# Patient Record
Sex: Female | Born: 1960 | Race: White | Hispanic: No | Marital: Married | Smoking: Former smoker
Health system: Southern US, Community
[De-identification: ages and names within clinical notes are randomized; demographics above are authoritative.]

## PROBLEM LIST (undated history)

## (undated) DIAGNOSIS — K602 Anal fissure, unspecified: Secondary | ICD-10-CM

## (undated) DIAGNOSIS — K219 Gastro-esophageal reflux disease without esophagitis: Secondary | ICD-10-CM

## (undated) DIAGNOSIS — K5790 Diverticulosis of intestine, part unspecified, without perforation or abscess without bleeding: Secondary | ICD-10-CM

## (undated) DIAGNOSIS — I499 Cardiac arrhythmia, unspecified: Secondary | ICD-10-CM

## (undated) DIAGNOSIS — E785 Hyperlipidemia, unspecified: Secondary | ICD-10-CM

## (undated) DIAGNOSIS — G4730 Sleep apnea, unspecified: Secondary | ICD-10-CM

## (undated) DIAGNOSIS — I2699 Other pulmonary embolism without acute cor pulmonale: Secondary | ICD-10-CM

## (undated) HISTORY — DX: Gastro-esophageal reflux disease without esophagitis: K21.9

## (undated) HISTORY — PX: ANAL FISSURE REPAIR: SHX2312

## (undated) HISTORY — DX: Sleep apnea, unspecified: G47.30

## (undated) HISTORY — PX: DILATION AND CURETTAGE OF UTERUS: SHX78

## (undated) HISTORY — DX: Cardiac arrhythmia, unspecified: I49.9

## (undated) HISTORY — PX: APPENDECTOMY: SHX54

## (undated) HISTORY — PX: TONSILLECTOMY: SUR1361

## (undated) HISTORY — DX: Hyperlipidemia, unspecified: E78.5

## (undated) HISTORY — DX: Diverticulosis of intestine, part unspecified, without perforation or abscess without bleeding: K57.90

## (undated) HISTORY — PX: CHOLECYSTECTOMY: SHX55

---

## 2011-02-27 DIAGNOSIS — I2699 Other pulmonary embolism without acute cor pulmonale: Secondary | ICD-10-CM

## 2011-02-27 HISTORY — DX: Other pulmonary embolism without acute cor pulmonale: I26.99

## 2014-10-31 ENCOUNTER — Encounter (HOSPITAL_COMMUNITY): Payer: Self-pay | Admitting: Cardiology

## 2014-10-31 ENCOUNTER — Emergency Department (HOSPITAL_COMMUNITY): Payer: Managed Care, Other (non HMO)

## 2014-10-31 ENCOUNTER — Observation Stay (HOSPITAL_COMMUNITY)
Admission: EM | Admit: 2014-10-31 | Discharge: 2014-11-01 | Disposition: A | Discharge status: Home / Self Care | Payer: Managed Care, Other (non HMO) | Attending: Family Medicine | Admitting: Family Medicine

## 2014-10-31 ENCOUNTER — Observation Stay (HOSPITAL_COMMUNITY): Payer: Managed Care, Other (non HMO)

## 2014-10-31 DIAGNOSIS — Z86711 Personal history of pulmonary embolism: Secondary | ICD-10-CM | POA: Diagnosis not present

## 2014-10-31 DIAGNOSIS — I2699 Other pulmonary embolism without acute cor pulmonale: Secondary | ICD-10-CM | POA: Diagnosis not present

## 2014-10-31 DIAGNOSIS — Z79899 Other long term (current) drug therapy: Secondary | ICD-10-CM | POA: Insufficient documentation

## 2014-10-31 DIAGNOSIS — Z7901 Long term (current) use of anticoagulants: Secondary | ICD-10-CM | POA: Diagnosis not present

## 2014-10-31 DIAGNOSIS — R0600 Dyspnea, unspecified: Secondary | ICD-10-CM | POA: Diagnosis not present

## 2014-10-31 DIAGNOSIS — E785 Hyperlipidemia, unspecified: Secondary | ICD-10-CM | POA: Diagnosis not present

## 2014-10-31 DIAGNOSIS — R0602 Shortness of breath: Secondary | ICD-10-CM | POA: Diagnosis present

## 2014-10-31 DIAGNOSIS — R0609 Other forms of dyspnea: Secondary | ICD-10-CM

## 2014-10-31 HISTORY — DX: Anal fissure, unspecified: K60.2

## 2014-10-31 HISTORY — DX: Other pulmonary embolism without acute cor pulmonale: I26.99

## 2014-10-31 LAB — COMPREHENSIVE METABOLIC PANEL
ALBUMIN: 4.6 g/dL (ref 3.5–5.0)
ALT: 31 U/L (ref 14–54)
AST: 23 U/L (ref 15–41)
Alkaline Phosphatase: 68 U/L (ref 38–126)
Anion gap: 8 (ref 5–15)
BILIRUBIN TOTAL: 0.6 mg/dL (ref 0.3–1.2)
BUN: 14 mg/dL (ref 6–20)
CHLORIDE: 109 mmol/L (ref 101–111)
CO2: 25 mmol/L (ref 22–32)
CREATININE: 0.71 mg/dL (ref 0.44–1.00)
Calcium: 10.1 mg/dL (ref 8.9–10.3)
GFR calc Af Amer: >60 mL/min (ref >60)
GLUCOSE: 111 mg/dL — AB (ref 65–99)
POTASSIUM: 3.8 mmol/L (ref 3.5–5.1)
Sodium: 142 mmol/L (ref 135–145)
TOTAL PROTEIN: 7.2 g/dL (ref 6.5–8.1)

## 2014-10-31 LAB — TROPONIN I: Troponin I: <0.03 ng/mL (ref <0.031)

## 2014-10-31 LAB — CBC WITH DIFFERENTIAL/PLATELET
BASOS ABS: 0 10*3/uL (ref 0.0–0.1)
BASOS PCT: 1 % (ref 0–1)
Eosinophils Absolute: 0.1 10*3/uL (ref 0.0–0.7)
Eosinophils Relative: 2 % (ref 0–5)
HEMATOCRIT: 41.7 % (ref 36.0–46.0)
Hemoglobin: 14.7 g/dL (ref 12.0–15.0)
LYMPHS PCT: 44 % (ref 12–46)
Lymphs Abs: 2.2 10*3/uL (ref 0.7–4.0)
MCH: 32.4 pg (ref 26.0–34.0)
MCHC: 35.3 g/dL (ref 30.0–36.0)
MCV: 91.9 fL (ref 78.0–100.0)
Monocytes Absolute: 0.4 10*3/uL (ref 0.1–1.0)
Monocytes Relative: 8 % (ref 3–12)
NEUTROS ABS: 2.4 10*3/uL (ref 1.7–7.7)
NEUTROS PCT: 45 % (ref 43–77)
Platelets: 259 10*3/uL (ref 150–400)
RBC: 4.54 MIL/uL (ref 3.87–5.11)
RDW: 12.4 % (ref 11.5–15.5)
WBC: 5.1 10*3/uL (ref 4.0–10.5)

## 2014-10-31 LAB — BRAIN NATRIURETIC PEPTIDE: B Natriuretic Peptide: 41 pg/mL (ref 0.0–100.0)

## 2014-10-31 MED ORDER — ASPIRIN EC 325 MG PO TBEC
325.0000 mg | DELAYED_RELEASE_TABLET | Freq: Every day | ORAL | Status: DC
Start: 2014-10-31 — End: 2014-11-01
  Administered 2014-10-31 – 2014-11-01 (×2): 325 mg via ORAL
  Filled 2014-10-31 (×2): qty 1

## 2014-10-31 MED ORDER — IOHEXOL 350 MG/ML SOLN
100.0000 mL | Freq: Once | INTRAVENOUS | Status: AC | PRN
  Administered 2014-10-31: 100 mL via INTRAVENOUS

## 2014-10-31 MED ORDER — RIVAROXABAN 20 MG PO TABS
20.0000 mg | ORAL_TABLET | Freq: Every day | ORAL | Status: DC
  Administered 2014-10-31: 20 mg via ORAL
  Filled 2014-10-31: qty 1

## 2014-10-31 MED ORDER — ROSUVASTATIN CALCIUM 10 MG PO TABS: 5.0000 mg | ORAL_TABLET | ORAL | Status: DC

## 2014-10-31 MED ORDER — ONDANSETRON HCL 4 MG/2ML IJ SOLN: 4.0000 mg | Freq: Four times a day (QID) | INTRAMUSCULAR | Status: DC | PRN

## 2014-10-31 MED ORDER — HYDROMORPHONE HCL 1 MG/ML IJ SOLN: 1.0000 mg | INTRAMUSCULAR | Status: AC | PRN

## 2014-10-31 MED ORDER — ACETAMINOPHEN 325 MG PO TABS
650.0000 mg | ORAL_TABLET | ORAL | Status: DC | PRN
  Administered 2014-10-31 – 2014-11-01 (×2): 650 mg via ORAL
  Filled 2014-10-31 (×2): qty 2

## 2014-10-31 MED ORDER — ONDANSETRON HCL 4 MG/2ML IJ SOLN
4.0000 mg | Freq: Three times a day (TID) | INTRAMUSCULAR | Status: DC | PRN
Start: 2014-10-31 — End: 2014-10-31

## 2014-10-31 NOTE — ED Notes (Signed)
Report given to [place], RN for room 323.

## 2014-10-31 NOTE — ED Provider Notes (Signed)
CSN: [id number]     Arrival date & time 10/31/14  1056 History  This chart was scribed for Lavera Guise, MD by Ronney Lion, ED Scribe. This patient was seen in room APA18/APA18 and the patient's care was started at 11:26 AM.    Chief Complaint  Patient presents with  . Shortness of Breath   The history is provided by the patient. No language interpreter was used.    HPI Comments: Sandi Towe is a 54 y.o. female with a history of hyperlipidemia and PE, who presents to the Emergency Department complaining of DOE that began 1 week ago when patient was walking through [employer] and suddenly felt SOB; she had gone home that evening and felt lightheaded and dizzy. Patient also notes an occasional flutter sensation in her chest. She states she had difficulty initially falling asleep due to her SOB, so she used her CPAP, which she has for sleep apnea, and slept on 2 pillows, both of which provided moderate relief.  Patient also reports feeling winded whenever she walks.   Patient is concerned because this may be a PE as she reports mid back pain that began 3-4 days ago that similar to the back pain she had during her first PE. She is currently anticoagulated on Xarelto, which she reports she has been compliant with.She had been evaluated at [facility] ED, where she had a D-dimer that was negative. Patient was also referred to a cardiologist at that time, and she scheduled to see a cardiologist on the 9th. She notes a history of "bad valves" in her family, noting that her brother died suddenly at age 9 and her nephew died at age 31 due to this.  She denies chest pain, leg swelling, abdominal pain, cough, congestion, rhinorrhea, vomiting, diarrhea, or any urinary symptoms.  Patient has a tick bite on her right shoulder. 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     Past Medical History  Diagnosis Date  . PE (pulmonary embolism)   . Anal fissure    Past Surgical  History  Procedure Laterality Date  . Cholecystectomy    . Appendectomy     History reviewed. No pertinent family history. Social History  Substance Use Topics  . Smoking status: Never Smoker   . Smokeless tobacco: None  . Alcohol Use: No   OB History    No data available     Review of Systems  HENT: Negative for congestion and rhinorrhea.   Respiratory: Positive for shortness of breath. Negative for cough.   Cardiovascular: Negative for chest pain and leg swelling.  Gastrointestinal: Negative for vomiting and diarrhea.  Genitourinary: Negative for dysuria, urgency, frequency, hematuria, decreased urine volume and difficulty urinating.  All other systems reviewed and are negative.   Allergies  Review of patient's allergies indicates no known allergies.  Home Medications   Prior to Admission medications   Medication Sig Start Date End Date Taking? Authorizing Provider  calcium-vitamin D (OSCAL WITH D) 500-200 MG-UNIT per tablet Take 1 tablet by mouth at bedtime.   Yes Historical Provider, MD  fenofibrate micronized (LOFIBRA) 134 MG capsule Take 134 mg by mouth every Monday, Wednesday, and Friday.   Yes Historical Provider, MD  Multiple Vitamin (MULTIVITAMIN WITH MINERALS) TABS tablet Take 1 tablet by mouth at bedtime.   Yes Historical Provider, MD  naproxen sodium (ALEVE) 220 MG tablet Take 440 mg by mouth 2 (two) times daily as needed (Pain).  Yes Historical Provider, MD  rivaroxaban (XARELTO) 20 MG TABS tablet Take 20 mg by mouth daily with supper.   Yes Historical Provider, MD  rosuvastatin (CRESTOR) 5 MG tablet Take 5 mg by mouth every Monday, Wednesday, and Friday.   Yes Historical Provider, MD   BP 124/66 mmHg  Pulse 60  Temp(Src) 98 F (36.7 C) (Oral)  Resp 18  Ht 5\' 5"  (1.651 m)  Wt 175 lb (79.379 kg)  BMI 29.12 kg/m2  SpO2 97% Physical Exam  Constitutional: She is oriented to person, place, and time. She appears well-developed and well-nourished. No distress.   HENT:  Head: Normocephalic and atraumatic.  Eyes: Conjunctivae and EOM are normal.  Neck: Neck supple. No tracheal deviation present.  Cardiovascular: Normal rate.   No BLE edema. No JVD.  Pulmonary/Chest: Effort normal. No respiratory distress.  Abdominal: Soft. She exhibits no distension. There is no tenderness.  Musculoskeletal: Normal range of motion. She exhibits no edema.  Neurological: She is alert and oriented to person, place, and time.  Skin: Skin is warm and dry.  Psychiatric: She has a normal mood and affect. Her behavior is normal.  Nursing note and vitals reviewed.   ED Course  Procedures (including critical care time)  DIAGNOSTIC STUDIES: Oxygen Saturation is 100% on RA, normal by my interpretation.    COORDINATION OF CARE: 11:32 AM - Discussed treatment plan with pt at bedside which includes basic blood tests, EKG, and CXR; also possibly CT scan pending CXR results. Will also perform echocardiogram. Pt has no other questions at this time. Discussed this with pt, who verbalized understanding and agreed to plan.   Labs Review Labs Reviewed  COMPREHENSIVE METABOLIC PANEL - Abnormal; Notable for the following:    Glucose, Bld 111 (*)    All other components within normal limits  CBC WITH DIFFERENTIAL/PLATELET  TROPONIN I  BRAIN NATRIURETIC PEPTIDE    Imaging Review Dg Chest 2 View  10/31/2014   CLINICAL DATA:  Shortness of breath. Lightheaded. Contents her breath.  EXAM: CHEST  2 VIEW  COMPARISON:  None.  FINDINGS: The heart size and mediastinal contours are within normal limits. Both lungs are clear. The visualized skeletal structures are unremarkable.  IMPRESSION: No active cardiopulmonary disease.   Electronically Signed   By: Elige Ko   On: 10/31/2014 12:43   I have personally reviewed and evaluated these images and lab results as part of my medical decision-making.   EKG Interpretation   Date/Time:  Sunday October 31 2014 11:42:21 EDT Ventricular  Rate:  57 PR Interval:  149 QRS Duration: 113 QT Interval:  398 QTC Calculation: 387 R Axis:   47 Text Interpretation:  Sinus bradycardia.  Poor r-wave progression TWI in  V2  Confirmed by Evamae Rowen MD, Katelin Kutsch [phone]) on 10/31/2014 12:47:14 PM      MDM   Final diagnoses:  Dyspnea on exertion    54 year old female with history of  hyperlipidemia and PE on Xarelto who presents with one week of dyspnea on exertion. She is well-appearing and nontoxic on presentation, with normal vital signs. She does not appear fluid overloaded on exam. The remainder of her exam is otherwise unremarkable and nonfocal. EKG is not ischemic in nature. Chest x-ray shows no acute cardiopulmonary processes, and troponin 1 is negative.   she recently had a negative d-dimer at an outside hospital and has been compliant with Xarelto.  my suspicion for additional PE burden is low at this time. she does notably have a heart  score of 5 for multiple risk factors, age, non-specific EKG changes, and Moderately suspicious story. Plan to admit for serial troponins and cardiac rule out.   Lavera Guise, MD 10/31/14 1322

## 2014-10-31 NOTE — ED Notes (Signed)
BP taken twice for verification.

## 2014-10-31 NOTE — ED Notes (Signed)
MD at bedside. 

## 2014-10-31 NOTE — ED Notes (Signed)
Hospitalist at bedside at this time,

## 2014-10-31 NOTE — ED Notes (Addendum)
Last Saturday had a episode where she was light headed and couldn't catch her breath.  Tuesday continued to have some sob and some back pain.   Wed went to the doctor. And was told she needed to f/u with cardiologist.  Has not seen cardiologist.  Burgess Estelle she seen another doctor for same.  Continues to have sob and back pain. Concerned because she has a history of PE's.  Take xarelto. Pt has medical records from doctors visits this week.

## 2014-10-31 NOTE — ED Notes (Signed)
PT maintained 02 sats >95% with ambulation.

## 2014-10-31 NOTE — H&P (Signed)
History and Physical  Kayla Hartman JWJ:[id number] DOB: November 30, 1960 DOA: 10/31/2014  Referring physician: Lavera Guise, MD PCP: No primary care provider on file.   Chief Complaint: Shortness of breath  HPI:  17 yow with PMH PE 2013, 2014 unprovoked,  presented to the ED with complaints of DOE. Initial evaluation was unrevealing and plans were made for observation, serial troponin and further evaluation.  Symptoms onset 8/27 she recalls not feeling well, light headness, shortness of breath, and diaphoresis. Shortness of breath, low energy, and back pain increasingly worsened without alleviating factors other than rest. Went to the ED in [place] 8/30 and was discharged with instructions to follow up with cardiologist. Had office visit 9/3 for ongoing symptoms of shortness of breath, upper back pain and chest tightness and was instructed to go to the ED, but instead went home.   Reports chest discomfort described as tight in the center of her chest, non radiating, worsened when trying to take a deep breath, and severity is rated as 4/10. Back pain is more so under her shoulder, is intermittent and worsened with exertion. Denies fever. Sore throat rash, muscle aches, nausea, vomiting, hematuria, night sweats. No orthopnea and does not wake up at night short of breath but does note shortness of breath when trying to go to sleep, primarily her shortness of breath is with exertion. No lower extremity edema. She reports compliance with Xarelto.  Record review  she brings records from outside facility with her  8/30 (ED). Vitals unremarkable, CBC, CMP unremarkable except calcium 10.6. D-dimer <0.27, troponin negative. CXR  Negative, shows impression near syncope SOB. EKG unremarkable.  9/3 (Office visit) Complaints of SOB and back pain was instructioned to go to the ED   In the emergency department here VSS, not hypoxic, afebrile.  Pertinent labs: CBC, BMP and troponin negative and CMP WNL. EKG:  Independently reviewed. SB nonspecific T waves changes.  Imaging: independently reviewed.CXR revealed no active cardiopulmonary disease.  Review of Systems:  Positive for SOB, chest discomfort  Negative for fever, visual changes, sore throat, rash, new muscle aches, dysuria, bleeding, n/v/abdominal pain.  Past Medical History  Diagnosis Date  . PE (pulmonary embolism) 2013    also 2014  . Anal fissure     Past Surgical History  Procedure Laterality Date  . Cholecystectomy    . Appendectomy    . Anal fissure repair      Social History:  reports that she has never smoked. She does not have any smokeless tobacco history on file. She reports that she does not drink alcohol or use illicit drugs. lives with their spouse Self-care  No Known Allergies  Family History  Problem Relation Age of Onset  . Cancer Mother     breast, ovarian     Prior to Admission medications   Medication Sig Start Date End Date Taking? Authorizing Provider  calcium-vitamin D (OSCAL WITH D) 500-200 MG-UNIT per tablet Take 1 tablet by mouth at bedtime.   Yes Historical Provider, MD  fenofibrate micronized (LOFIBRA) 134 MG capsule Take 134 mg by mouth every Monday, Wednesday, and Friday.   Yes Historical Provider, MD  Multiple Vitamin (MULTIVITAMIN WITH MINERALS) TABS tablet Take 1 tablet by mouth at bedtime.   Yes Historical Provider, MD  naproxen sodium (ALEVE) 220 MG tablet Take 440 mg by mouth 2 (two) times daily as needed (Pain).   Yes Historical Provider, MD  rivaroxaban (XARELTO) 20 MG TABS tablet Take 20 mg by mouth daily with supper.  Yes Historical Provider, MD  rosuvastatin (CRESTOR) 5 MG tablet Take 5 mg by mouth every Monday, Wednesday, and Friday.   Yes Historical Provider, MD   Physical Exam: Filed Vitals:   10/31/14 1130 10/31/14 1200 10/31/14 1230 10/31/14 1245  BP: 198/92 121/81 124/66   Pulse: 70 58 56 60  Temp:      TempSrc:      Resp: 22 17 18    Height:      Weight:      SpO2:  98% 94% 98% 97%    General: Appears calm and comfortable. Sitting up in bed.  Eyes: PERRL, normal lids, irises & conjunctiva ENT: grossly normal hearing, lips & tongue Neck: no LAD, masses or thyromegaly Cardiovascular: RRR, no m/r/g. No LE edema. Respiratory: CTA bilaterally, no w/r/r. Normal respiratory effort. Able to speak in full sentences.  Abdomen: soft, ntnd Skin: no rash or induration noted  Musculoskeletal: grossly normal tone BUE/BLE Psychiatric: grossly normal mood and affect, speech fluent and appropriate Neurologic: grossly non-focal.  Wt Readings from Last 3 Encounters:  10/31/14 79.379 kg (175 lb)    Labs on Admission:  Basic Metabolic Panel:  Recent Labs Lab 10/31/14 1202  NA 142  K 3.8  CL 109  CO2 25  GLUCOSE 111*  BUN 14  CREATININE 0.71  CALCIUM 10.1    Liver Function Tests:  Recent Labs Lab 10/31/14 1202  AST 23  ALT 31  ALKPHOS 68  BILITOT 0.6  PROT 7.2  ALBUMIN 4.6    CBC:  Recent Labs Lab 10/31/14 1202  WBC 5.1  NEUTROABS 2.4  HGB 14.7  HCT 41.7  MCV 91.9  PLT 259    Cardiac Enzymes:  Recent Labs Lab 10/31/14 1202  TROPONINI <0.03    Radiological Exams on Admission: Dg Chest 2 View  10/31/2014   CLINICAL DATA:  Shortness of breath. Lightheaded. Contents her breath.  EXAM: CHEST  2 VIEW  COMPARISON:  None.  FINDINGS: The heart size and mediastinal contours are within normal limits. Both lungs are clear. The visualized skeletal structures are unremarkable.  IMPRESSION: No active cardiopulmonary disease.   Electronically Signed   By: Elige Ko   On: 10/31/2014 12:43      Principal Problem:   DOE (dyspnea on exertion) Active Problems:   Pulmonary embolism   Assessment/Plan 1. DOE, thoracic back pain with h/o PE. No history of CAD, negative of LHC 2005.Also consider anginal equivalency. Troponin negative, BNP unremarkable, no lower extremity edema and EKG non acute, no evidence of ACS. 2.  PE 2013 treated with 6  months of anticoagulation and again 2014 unprovoked. On indefinite Xarelto. Reports strict compliance.   Plan observation. Etiology is unclear at this point. Consider anginal equivalent, CAD. PE seems less likely given negative d-dimer and strict compliance with Xarelto but she wishes further investigation with chest CT, understands exposure to radiation.  Telemetry and trend troponin. Check 2D ECHO  CTA chest rule out PE  If negative likely discharge tomorrow with instructions to follow up with cardiology for consideration of stress test.   Code Status: FULL   DVT prophylaxis:Xarelto  Family Communication:Husband bedside, discussed in detail Disposition Plan/Anticipated LOS: Overnight observation, anticipate discharge home tomorrow.   Time spent: 55 minutes  Brendia Sacks, MD  [facility] Pager [phone] 10/31/2014, 1:16 PM   I, Princella Pellegrini. Jari Pigg, acting as scribe, recorded this note contemporaneously in the presence of Dr. Melton Alar. Irene Limbo, M.D. on 10/31/2014 .  I have reviewed the above documentation  for accuracy and completeness, and I agree with the above. Brendia Sacks, MD

## 2014-11-01 ENCOUNTER — Observation Stay (HOSPITAL_BASED_OUTPATIENT_CLINIC_OR_DEPARTMENT_OTHER): Payer: Managed Care, Other (non HMO)

## 2014-11-01 DIAGNOSIS — I2699 Other pulmonary embolism without acute cor pulmonale: Secondary | ICD-10-CM | POA: Diagnosis not present

## 2014-11-01 DIAGNOSIS — R0600 Dyspnea, unspecified: Secondary | ICD-10-CM | POA: Diagnosis not present

## 2014-11-01 DIAGNOSIS — R0609 Other forms of dyspnea: Secondary | ICD-10-CM | POA: Diagnosis not present

## 2014-11-01 LAB — TROPONIN I

## 2014-11-01 NOTE — Care Management Note (Signed)
Case Management Note  Patient Details  Name: Leotta Weingarten MRN: [id number] Date of Birth: 04/06/1960  Expected Discharge Date:  11/02/14               Expected Discharge Plan:  Home/Self Care  In-House Referral:  NA  Discharge planning Services  CM Consult  Post Acute Care Choice:  NA Choice offered to:  NA  DME Arranged:    DME Agency:     HH Arranged:    HH Agency:     Status of Service:  Completed, signed off  Medicare Important Message Given:    Date Medicare IM Given:    Medicare IM give by:    Date Additional Medicare IM Given:    Additional Medicare Important Message give by:     If discussed at Long Length of Stay Meetings, dates discussed:    Additional Comments: Pt is from home, lives alone and independent at baseline. Pt discharging home with self care today. Pt does not meet requirements for home O2. No CM needs noted.  Malcolm Metro, RN 11/01/2014, 3:51 PM

## 2014-11-01 NOTE — Progress Notes (Signed)
Notified Goodrich MD to ask if the patients diet could be advanced.  Voiced to him according to the ECHO Tech that the nurse who does this test is not here.  New orders given and followed.

## 2014-11-01 NOTE — Discharge Summary (Signed)
Physician Discharge Summary  Kayla Hartman ZOX:[id number] DOB: Jul 13, 1960 DOA: 10/31/2014  Cardiologist: Mable Paris, MD   Admit date: 10/31/2014 Discharge date: 11/01/2014  Recommendations for Outpatient Follow-up:  1. Follow up with cardiologist for DOE and consideration of stress test.   Follow-up Information    Follow up with HUFFMAN, Vonzell Schlatter, MD.   Specialty:  Internal Medicine   Why:  As needed   Contact information:   [address] [place] [address] [phone]        Discharge Diagnoses:  1. DOE 2. History of PE  Discharge Condition: Improved  Disposition: Discharge home   Diet recommendation: Heart healthy   Filed Weights   10/31/14 1104 10/31/14 1623  Weight: 79.379 kg (175 lb) 83.178 kg (183 lb 6 oz)    History of present illness:  44 yow with PMH PE 2013, 2014 unprovoked, presented to the ED with complaints of DOE. Initial evaluation was unrevealing and plans were made for observation, serial troponin and further evaluation.  Hospital Course:  Mrs. Neuenfeldt hospitalization was uncomplicated. DOE improved spontaneously with no hypoxia or SOB while ambulating. All labs and imagining were unremarkable, EKG non acute, telemetry SB and troponins negative. CTA chest negative. 2d echocardiogram reassuring. There was no evidence of ACS or PE. Etiology of DOE remains unclear. Instructed to keep follow up with cardiologist this week for consideration of stress testing and further evaluation.  Individual issues as below:    1. DOE, thoracic back pain with h/o PE. CT of angiogram negative. Telemetry SB. No history of CAD, negative of LHC 2005. Consider anginal equivalent. No current pain. Troponin negative, BNP unremarkable, echocardiogram reassuring, EKG non acute, no evidence of ACS. 2. PE 2013 treated with 6 months of anticoagulation and again 2014 unprovoked. On indefinite Xarelto.  Consultants:  None (cardiology not available over the  weekend)  Procedures:  Echo Transthoracic Echocardiography  Patient:  Kayla Hartman, Kayla Hartman MR #:    [id number] Study Date: 11/01/2014 Gender:   F Age:    54 Height:   162.6 cm Weight:   83 kg BSA:    1.97 m^2 Pt. Status: Room:    A323  ADMITTING  Standley Brooking ATTENDING  Warnell Forester REFERRING  Standley Brooking SONOGRAPHER Jeryl Columbia PERFORMING  [facility], Jeani Hawking  cc:  ------------------------------------------------------------------- LV EF: 55% -  60%  ------------------------------------------------------------------- Indications:   Dyspnea 786.09.  ------------------------------------------------------------------- History:  PMH: Dyspnea on exertion, Pulmonary Embolism, Patient stated,&quot; Mother, Father and brother had mitral valve disorders. Nephew died age 71 from arrhythmia.&quot;  ------------------------------------------------------------------- Study Conclusions  - Left ventricle: The cavity size was normal. Wall thickness was increased in a pattern of mild LVH. Systolic function was normal. The estimated ejection fraction was in the range of 55% to 60%. Wall motion was normal; there were no regional wall motion abnormalities. - Atrial septum: No defect or patent foramen ovale was identified.  Transthoracic echocardiography. M-mode, complete 2D, spectral Doppler, and color Doppler. Birthdate: Patient birthdate: [birth year]. Age: Patient is 54 yr old. Sex: Gender: female. BMI: 31.4 kg/m^2. Blood pressure:   133/70 Patient status: Inpatient. Study date: Study date: 11/01/2014. Study time: 08:26 AM. Location: Bedside.  -------------------------------------------------------------------  ------------------------------------------------------------------- Left ventricle: The cavity size was normal. Wall thickness was increased in a pattern of  mild LVH. Systolic function was normal. The estimated ejection fraction was in the range of 55% to 60%. Wall motion was normal; there were no regional wall motion abnormalities.  -------------------------------------------------------------------  Aortic valve:  Trileaflet; normal thickness leaflets. Mobility was not restricted. Doppler: Transvalvular velocity was within the normal range. There was no stenosis. There was no regurgitation.  ------------------------------------------------------------------- Aorta: Aortic root: The aortic root was normal in size.  ------------------------------------------------------------------- Mitral valve:  Structurally normal valve.  Mobility was not restricted. Doppler: Transvalvular velocity was within the normal range. There was no evidence for stenosis. There was no regurgitation.  Peak gradient (D): 4 mm Hg.  ------------------------------------------------------------------- Left atrium: The atrium was normal in size.  ------------------------------------------------------------------- Atrial septum: No defect or patent foramen ovale was identified.  ------------------------------------------------------------------- Right ventricle: The cavity size was normal. Wall thickness was normal. Systolic function was normal.  ------------------------------------------------------------------- Pulmonic valve:  Doppler: Transvalvular velocity was within the normal range. There was no evidence for stenosis.  ------------------------------------------------------------------- Tricuspid valve:  Structurally normal valve.  Doppler: Transvalvular velocity was within the normal range. There was trivial regurgitation.  ------------------------------------------------------------------- Pulmonary artery:  The main pulmonary artery was normal-sized. Systolic pressure was within the normal  range.  ------------------------------------------------------------------- Right atrium: The atrium was normal in size.  ------------------------------------------------------------------- Pericardium: The pericardium was normal in appearance. There was no pericardial effusion.  ------------------------------------------------------------------- Systemic veins: Inferior vena cava: The vessel was normal in size.  ------------------------------------------------------------------- Measurements  Left ventricle             Value    Reference LV ID, ED, PLAX chordal    (L)   41.2 mm   43 - 52 LV ID, ES, PLAX chordal        26.5 mm   23 - 38 LV fx shortening, PLAX chordal     36  %   >=29 LV PW thickness, ED          14.3 mm   --------- IVS/LV PW ratio, ED          0.92     <=1.3 LV e&', lateral             10.1 cm/s  --------- LV E/e&', lateral            9.5     --------- LV e&', medial             7.72 cm/s  --------- LV E/e&', medial            12.44    --------- LV e&', average             8.91 cm/s  --------- LV E/e&', average            10.77    ---------  Ventricular septum           Value    Reference IVS thickness, ED           13.2 mm   ---------  LVOT                  Value    Reference LVOT ID, S               20  mm   --------- LVOT area               3.14 cm^2  ---------  Aorta                 Value    Reference Aortic root ID, ED           28  mm   ---------  Left atrium  Value    Reference LA ID, A-P, ES             37  mm   --------- LA ID/bsa, A-P             1.88 cm/m^2 <=2.2 LA volume, S               57.2 ml   --------- LA volume/bsa, S            29.1 ml/m^2 --------- LA volume, ES, 1-p A4C         63.7 ml   --------- LA volume/bsa, ES, 1-p A4C       32.4 ml/m^2 --------- LA volume, ES, 1-p A2C         48.8 ml   --------- LA volume/bsa, ES, 1-p A2C       24.8 ml/m^2 ---------  Mitral valve              Value    Reference Mitral E-wave peak velocity      96  cm/s  --------- Mitral A-wave peak velocity      65  cm/s  --------- Mitral deceleration time    (H)   264  ms   150 - 230 Mitral peak gradient, D        4   mm Hg --------- Mitral E/A ratio, peak         1.5     ---------  Pulmonary arteries           Value    Reference PA pressure, S, DP           27  mm Hg <=30  Tricuspid valve            Value    Reference Tricuspid regurg peak velocity     221  cm/s  --------- Tricuspid peak RV-RA gradient     20  mm Hg ---------  Systemic veins             Value    Reference Estimated CVP             7   mm Hg ---------  Right ventricle            Value    Reference TAPSE                 19.8 mm   --------- RV pressure, S, DP           27  mm Hg <=30  Legend: (L) and (H) mark values outside specified reference range.  ------------------------------------------------------------------- Prepared and Electronically Authenticated by  Charlton Haws, M.D. 2016-09-05T14:14:01  Antibiotics:  None   Discharge Instructions   Discharge Instructions    Activity as tolerated - No restrictions    Complete by:  As directed      Diet general    Complete by:  As directed      Discharge instructions    Complete by:  As directed   Call your physician or seek immediate medical attention for  chest or back pain, shortness of breath, swelling, heavy sweats, pain left arm, neck or jaw or worsening of condition.             Current Discharge Medication List    CONTINUE these medications which have NOT CHANGED   Details  calcium-vitamin D (OSCAL WITH D) 500-200 MG-UNIT per tablet Take 1 tablet by mouth at bedtime.    fenofibrate micronized (LOFIBRA) 134 MG capsule  Take 134 mg by mouth every Monday, Wednesday, and Friday.    Multiple Vitamin (MULTIVITAMIN WITH MINERALS) TABS tablet Take 1 tablet by mouth at bedtime.    naproxen sodium (ALEVE) 220 MG tablet Take 440 mg by mouth 2 (two) times daily as needed (Pain).    rivaroxaban (XARELTO) 20 MG TABS tablet Take 20 mg by mouth daily with supper.    rosuvastatin (CRESTOR) 5 MG tablet Take 5 mg by mouth every Monday, Wednesday, and Friday.       No Known Allergies  The results of significant diagnostics from this hospitalization (including imaging, microbiology, ancillary and laboratory) are listed below for reference.    Significant Diagnostic Studies: Dg Chest 2 View  10/31/2014   CLINICAL DATA:  Shortness of breath. Lightheaded. Contents her breath.  EXAM: CHEST  2 VIEW  COMPARISON:  None.  FINDINGS: The heart size and mediastinal contours are within normal limits. Both lungs are clear. The visualized skeletal structures are unremarkable.  IMPRESSION: No active cardiopulmonary disease.   Electronically Signed   By: Elige Ko   On: 10/31/2014 12:43   Ct Angio Chest Pe W/cm &/or Wo Cm  10/31/2014   CLINICAL DATA:  54 year old female with dyspnea on exertion and chest tightness. History of pulmonary embolism in 2013 and 2014.  EXAM: CT ANGIOGRAPHY CHEST WITH CONTRAST  TECHNIQUE: Multidetector CT imaging of the chest was performed using the standard protocol during bolus administration of intravenous contrast. Multiplanar CT image reconstructions and MIPs were obtained to evaluate the vascular anatomy.  CONTRAST:   OMNIPAQUE IOHEXOL 350 MG/ML SOLN  COMPARISON:  Radiograph dated 10/31/2014  FINDINGS: The lungs are clear. The central airways are patent. There is no pleural effusion or pneumothorax.  The visualized thoracic aorta appears unremarkable. No CT evidence of pulmonary embolism. There is no hilar or mediastinal adenopathy. There is no cardiomegaly or pericardial effusion. The visualized thyroid gland appears unremarkable.  The chest wall soft tissues and visualized osseous structures are intact. Cholecystectomy clips. A small splenule. The visualized upper abdomen is otherwise unremarkable.  Review of the MIP images confirms the above findings.  IMPRESSION: No CT evidence of pulmonary embolism.   Electronically Signed   By: Elgie Collard M.D.   On: 10/31/2014 18:45    Labs: Basic Metabolic Panel:  Recent Labs Lab 10/31/14 1202  NA 142  K 3.8  CL 109  CO2 25  GLUCOSE 111*  BUN 14  CREATININE 0.71  CALCIUM 10.1   Results for CHLOEY, RICARD (MRN [id number]) as of 11/01/2014 16:12  Ref. Range 10/31/2014 18:50 11/01/2014 01:18 11/01/2014 05:31  Troponin I Latest Ref Range: <0.031 ng/mL <0.03 <0.03 <0.03    Liver Function Tests:  Recent Labs Lab 10/31/14 1202  AST 23  ALT 31  ALKPHOS 68  BILITOT 0.6  PROT 7.2  ALBUMIN 4.6   CBC:  Recent Labs Lab 10/31/14 1202  WBC 5.1  NEUTROABS 2.4  HGB 14.7  HCT 41.7  MCV 91.9  PLT 259   Cardiac Enzymes:  Recent Labs Lab 10/31/14 1202 10/31/14 1850 11/01/14 0118 11/01/14 0531  TROPONINI <0.03 <0.03 <0.03 <0.03    Recent Labs  10/31/14 1202  BNP 41.0    Principal Problem:   DOE (dyspnea on exertion) Active Problems:   Pulmonary embolism   Time coordinating discharge: 35 minutes   Signed:  Brendia Sacks, MD [facility] 11/01/2014, 7:50 AM   I, Princella Pellegrini. Jari Pigg, acting as scribe, recorded this note contemporaneously in the presence of  Dr. Melton Alar. Irene Limbo, M.D. on 11/01/2014 .  I have reviewed the above  documentation for accuracy and completeness, and I agree with the above. Brendia Sacks, MD

## 2014-11-01 NOTE — Progress Notes (Signed)
  PROGRESS NOTE  Kayla Hartman ZOX:[id number] DOB: 11-Feb-1961 DOA: 10/31/2014 PCP: Dr. Jilda Panda, MD  Summary: 6 yow with PMH PE 2013, 2014 unprovoked, presented to the ED with complaints of DOE. Initial evaluation was unrevealing and plans were made for observation, serial troponin and further evaluation  Assessment/Plan: 1. DOE, thoracic back pain with h/o PE. CT of angiogram negative. Telemetry SB. No history of CAD, negative of LHC 2005. Consider anginal equivalency, but not typical. No current pain. Troponin negative, BNP unremarkable, no lower extremity edema and EKG non acute, no evidence of ACS. 2. PE 2013 treated with 6 months of anticoagulation and again 2014 unprovoked. On indefinite Xarelto.     Improved, no evidence of ACS.   Follow up echo, if negative, will discharge home.   Has follow up 9/9 with cardiology for consideration of stress test.   Code Status: Full DVT prophylaxis: Xarelto  Family Communication: Discussed with patient who understands and has no concerns at this time. Disposition Plan: Discharge today  Brendia Sacks, MD  [facility]  Pager [phone] If 7PM-7AM, please contact night-coverage at [email or web address], password [email or web address] 11/01/2014, 10:29 AM    Consultants:    Procedures:  ECHO pending  Antibiotics:    HPI/Subjective: Headache. Intermittent, mild back pain under shoulder blades with no known-triggers. No nausea or vomiting. Denies SOB. No chest pain.  Objective: Filed Vitals:   10/31/14 1623 10/31/14 2113 11/01/14 0135 11/01/14 0559  BP: 132/56 144/79 127/64 133/70  Pulse: 61 50 47 47  Temp: 98.1 F (36.7 C) 98 F (36.7 C) 98.1 F (36.7 C) 97.7 F (36.5 C)  TempSrc: Oral Oral Oral Oral  Resp:  Height:  (1.626 m)     Weight: 83.178 kg (183 lb 6 oz)     SpO2: 98% 99% 98% 98%    Intake/Output Summary (Last 24 hours) at 11/01/14 1029 Last data filed at 10/31/14 1746  Gross per 24 hour  Intake    240 ml    Output      0 ml  Net    240 ml    Filed Weights   10/31/14 1104 10/31/14 1623  Weight: 79.379 kg (175 lb) 83.178 kg (183 lb 6 oz)    Exam:   Afebrile, VSS, not hypoxic. Bradycardia General:  Appears calm and comfortable Eyes: PERRL, normal lids, irises & conjunctiva ENT: grossly normal hearing, lips & tongue Cardiovascular: RRR, no m/r/g. No LE edema. Telemetry: SB Respiratory: CTA bilaterally, no w/r/r. Normal respiratory effort. Abdomen: soft, ntnd Psychiatric: grossly normal mood and affect, speech fluent and appropriate  New data reviewed:  Repeat troponin negative   Pertinent data since admission:  CTA chest revealed no CT evidence of pulmonary embolism.  CXR revealed no active cardiopulmonary disease.   Pending data:    Scheduled Meds: . aspirin EC  325 mg Oral Daily  . rivaroxaban  20 mg Oral Q supper  . rosuvastatin  5 mg Oral Q M,W,F-1800   Continuous Infusions:   Principal Problem:   DOE (dyspnea on exertion) Active Problems:   Pulmonary embolism   I, Jessica D. Jari Pigg, acting as scribe, recorded this note contemporaneously in the presence of Dr. Melton Alar. Irene Limbo, M.D. on 11/01/2014 .  I have reviewed the above documentation for accuracy and completeness, and I agree with the above. Brendia Sacks, MD

## 2014-11-01 NOTE — Progress Notes (Signed)
*  PRELIMINARY RESULTS* Echocardiogram 2D Echocardiogram has been performed.  Jeryl Columbia 11/01/2014, 9:04 AM

## 2014-11-01 NOTE — Progress Notes (Signed)
Patient discharged with instructions, prescription, and care notes.  Verbalized understanding via teach back.  IV was removed and the site was WNL. Patient voiced no further complaints or concerns at the time of discharge.  Appointments scheduled per instructions.  Patient left the floor via w/c with staff and family in stable condition. 

## 2016-09-06 IMAGING — DX DG CHEST 2V
2 series · 2 of 2 positions shown · non-contrast
Comparison: None.

CLINICAL DATA: Shortness of breath. Lightheaded. Contents her
breath.

EXAM:
CHEST  2 VIEW

[chest pa]
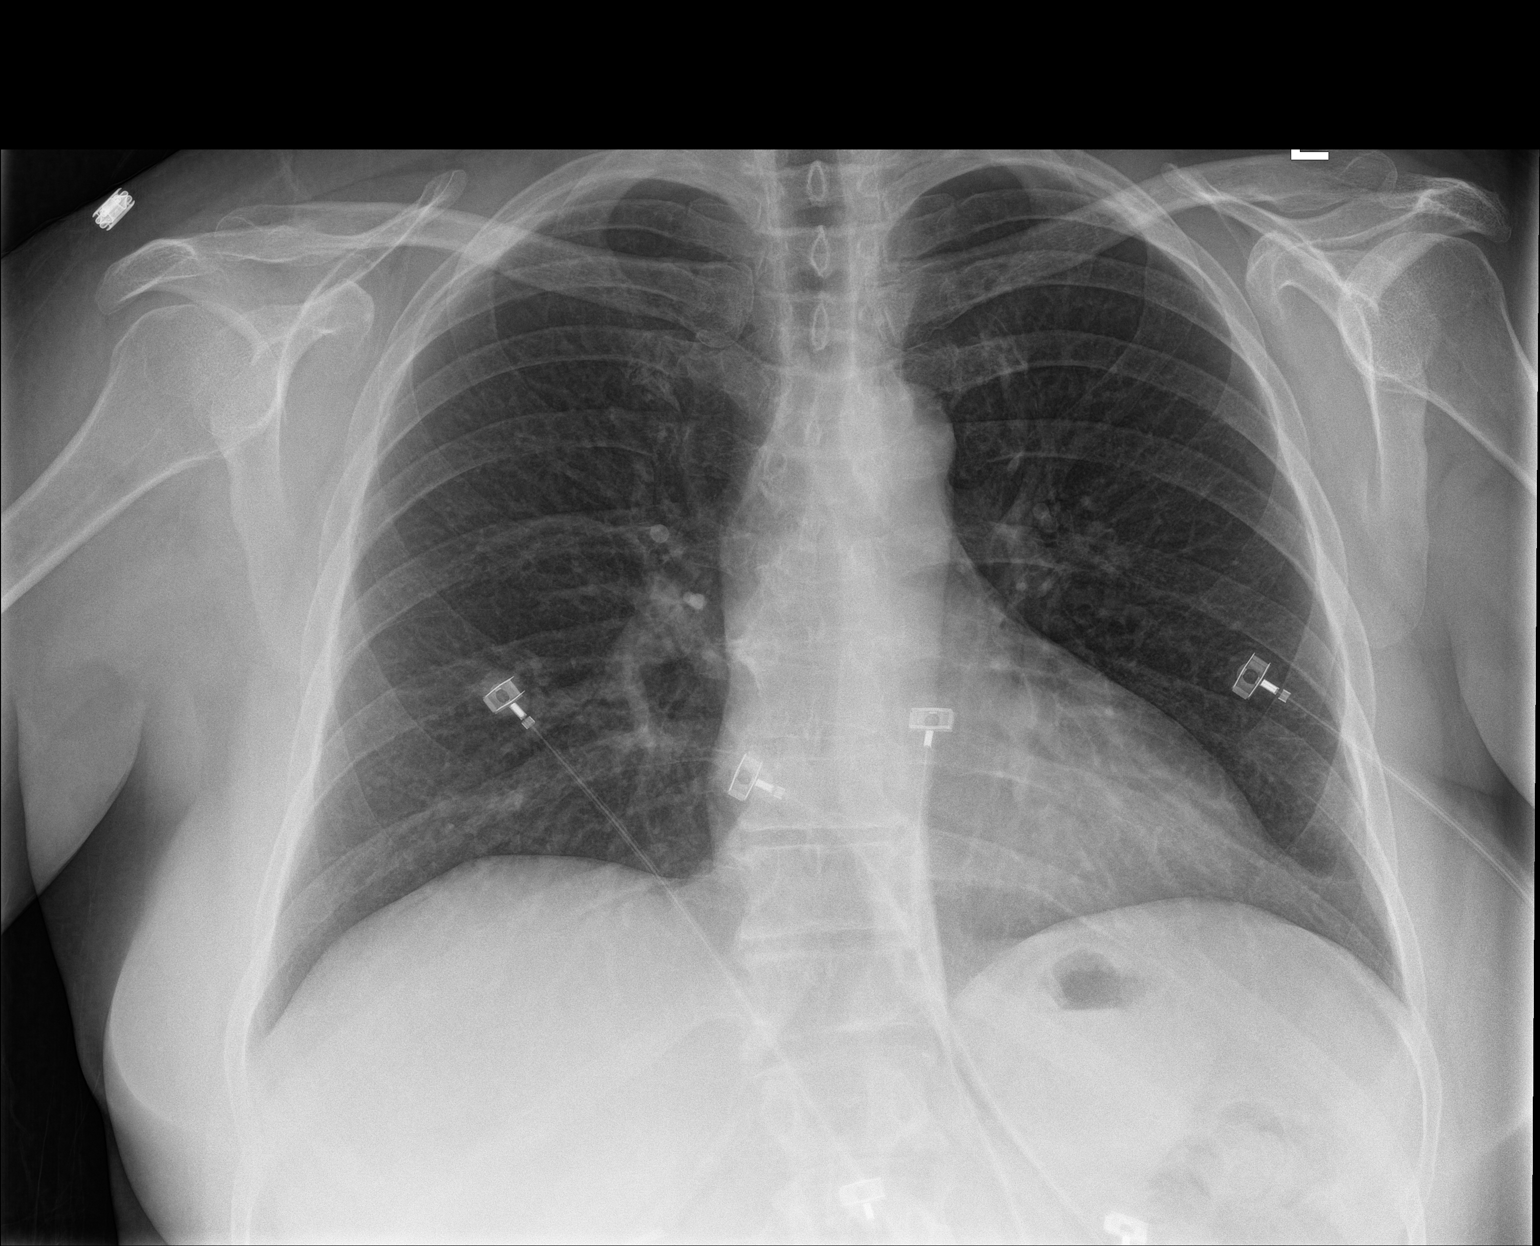

[chest lat]
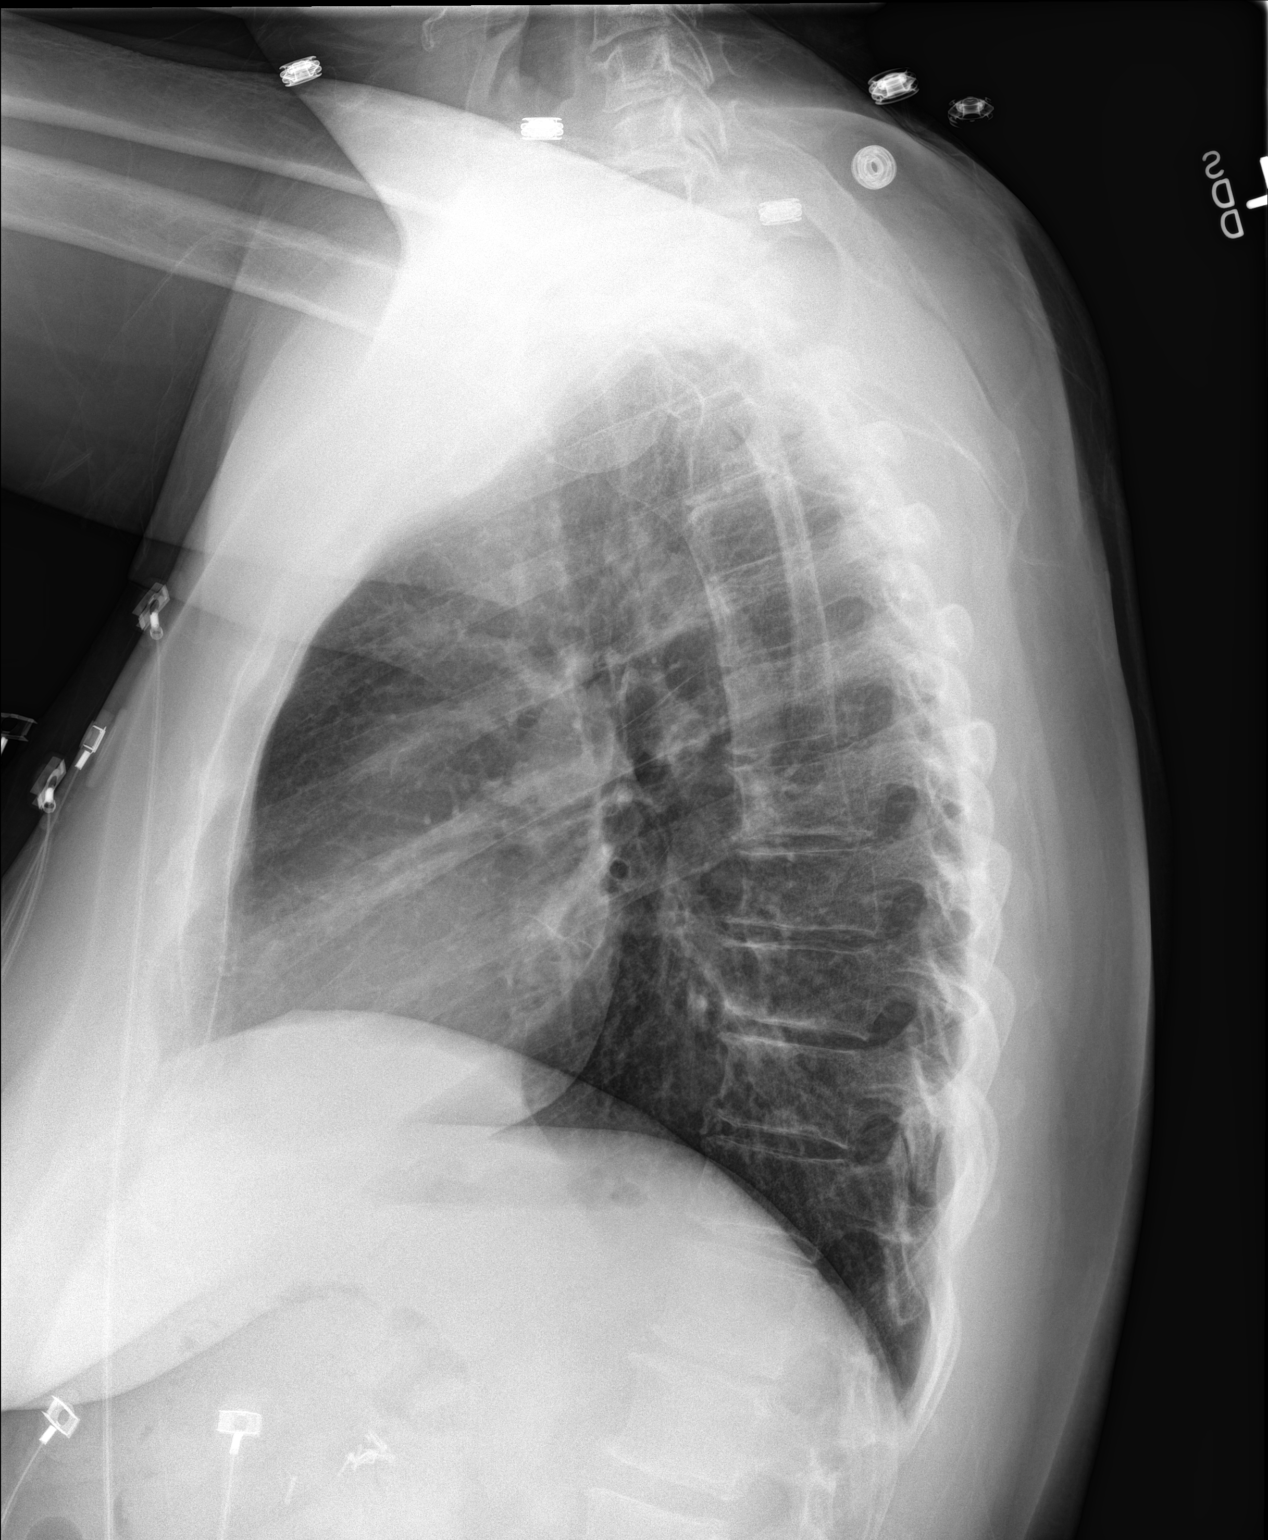

[2 of 2 positions shown; findings below may reference images not displayed]

FINDINGS: The heart size and mediastinal contours are within normal limits.
Both lungs are clear. The visualized skeletal structures are
unremarkable.
IMPRESSION: No active cardiopulmonary disease.

## 2022-08-07 ENCOUNTER — Encounter: Payer: Self-pay | Admitting: Physician Assistant

## 2022-08-16 ENCOUNTER — Telehealth: Payer: Self-pay | Admitting: Gastroenterology

## 2022-08-16 NOTE — Telephone Encounter (Signed)
Called patient home phone and also spouse multiple times. Was unable to speak with someone and there is no voicemail set up for either phone. Patient needed to be rescheduled for ov.

## 2022-10-05 ENCOUNTER — Ambulatory Visit: Payer: BC Managed Care – PPO | Admitting: Nurse Practitioner

## 2022-10-05 ENCOUNTER — Encounter: Payer: Self-pay | Admitting: Nurse Practitioner

## 2022-10-05 ENCOUNTER — Ambulatory Visit: Payer: Managed Care, Other (non HMO) | Admitting: Physician Assistant

## 2022-10-05 VITALS — BP 128/76 | HR 60 | Ht 64.0 in | Wt 194.0 lb

## 2022-10-05 DIAGNOSIS — R197 Diarrhea, unspecified: Secondary | ICD-10-CM

## 2022-10-05 NOTE — Patient Instructions (Signed)
Contact our office if diarrhea recurs.  _______________________________________________________  If your blood pressure at your visit was 140/90 or greater, please contact your primary care physician to follow up on this.  _______________________________________________________  If you are age 64 or older, your body mass index should be between 23-30. Your Body mass index is 33.3 kg/m. If this is out of the aforementioned range listed, please consider follow up with your Primary Care Provider.  If you are age 48 or younger, your body mass index should be between 19-25. Your Body mass index is 33.3 kg/m. If this is out of the aformentioned range listed, please consider follow up with your Primary Care Provider.   ________________________________________________________  The [facility] providers would like to encourage you to use [facility] to communicate with providers for non-urgent requests or questions.  Due to long hold times on the telephone, sending your provider a message by [facility] may be a faster and more efficient way to get a response.  Please allow 48 business hours for a response.  Please remember that this is for non-urgent requests.  _______________________________________________________  Due to recent changes in healthcare laws, you may see the results of your imaging and laboratory studies on MyChart before your provider has had a chance to review them.  We understand that in some cases there may be results that are confusing or concerning to you. Not all laboratory results come back in the same time frame and the provider may be waiting for multiple results in order to interpret others.  Please give [place] 48 hours in order for your provider to thoroughly review all the results before contacting the office for clarification of your results.   Thank you for trusting me with your gastrointestinal care!   Alcide Evener, CRNP

## 2022-10-05 NOTE — Progress Notes (Signed)
10/05/2022 Kayla Hartman [id number] January 05, 1961   CHIEF COMPLAINT: Diarrhea   HISTORY OF PRESENT ILLNESS: Kayla Hartman is a 62 year old female with a past medical history of hyperlipidemia, bradycardia.  PE x 2 around 2006 on Xarelto, sleep apnea uses CPAP, GERD and diverticulitis. Past appendectomy and cholecystectomy.  She presents to our office today as referred by Kayla Gerald NP for further evaluation regarding diarrhea.  She went on a camping trip with her family to [facility] April 2024 and awakened one night with abdominal bloat, multiple episodes of nonbloody diarrhea and she vomited x 1 episode.  No hematemesis.  She had horrible belching.  The next day she was fine.  She ate shrimp for lunch the prior day.  Two weeks later, she had similar diarrhea and vomited zucchini which she ate the night before without hematemesis and the next day she felt fine.  She contacted her PCP who thought she might have strep throat and prescribed an antibiotic for 10 days.  She denied having any diarrhea while she took the antibiotic.  Two weeks later, she had recurrence of the same nonbloody diarrhea which abated by the next day without recurrence.  She was seen by her PCP and she reported stool cultures and blood tests were normal.  She underwent a screening colonoscopy by surgeon Dr. Esperanza Heir with [facility] 08/27/2022 which showed multiple reticulum in the descending, ascending and sigmoid colon without evidence of colitis or colon polyps.  She denies having any No diarrhea for the past 2 months.  No abdominal pain.  No GERD symptoms on Omeprazole 20 mg daily.  Never had an EGD.  She is eating a regular diet.  She is on Xarelto due to history of PE x 2 eighteen years ago, etiology unclear.    Past Medical History:  Diagnosis Date   Anal fissure    Cardiac arrhythmia    Diverticulosis    GERD (gastroesophageal reflux disease)    HLD (hyperlipidemia)    PE (pulmonary embolism) 02/27/2011   also  2014   Sleep apnea with use of continuous positive airway pressure (CPAP)    Past Surgical History:  Procedure Laterality Date   ANAL FISSURE REPAIR     APPENDECTOMY     CHOLECYSTECTOMY     DILATION AND CURETTAGE OF UTERUS     TONSILLECTOMY     Social History: She is married.  She has 3 daughters.  She is a bookkeeper.  Smoked cigarettes for a few years as a teenage. No alcohol use. No drug use.   Family History: Mother had breast and ovarian cancer. No known family history of esophageal, gastric or colon cancer.  No Known Allergies   Outpatient Encounter Medications as of 10/05/2022  Medication Sig   alendronate (FOSAMAX) 70 MG tablet Take 70 mg by mouth once a week.   calcium-vitamin D (OSCAL WITH D) 500-200 MG-UNIT per tablet Take 1 tablet by mouth at bedtime.   ELDERBERRY PO Take 1 capsule by mouth daily.   fenofibrate micronized (LOFIBRA) 134 MG capsule Take 134 mg by mouth every Monday, Wednesday, and Friday.   GLUCOSAMINE-CHONDROITIN PO Take 1 capsule by mouth daily.   Multiple Vitamin (MULTIVITAMIN WITH MINERALS) TABS tablet Take 1 tablet by mouth at bedtime.   omeprazole (PRILOSEC) 20 MG capsule Take 20 mg by mouth daily.   rivaroxaban (XARELTO) 20 MG TABS tablet Take 20 mg by mouth daily with supper.   rosuvastatin (CRESTOR) 5 MG tablet Take 5 mg  by mouth every Monday, Wednesday, and Friday.   [DISCONTINUED] naproxen sodium (ALEVE) 220 MG tablet Take 440 mg by mouth 2 (two) times daily as needed (Pain).   No facility-administered encounter medications on file as of 10/05/2022.   REVIEW OF SYSTEMS:  Gen: Denies fever, sweats or chills. No weight loss.  CV: Denies chest pain, palpitations or edema. Resp: Denies cough, shortness of breath of hemoptysis.  GI: See HPI. GU: Denies urinary burning, blood in urine, increased urinary frequency or incontinence. MS: Denies joint pain, muscles aches or weakness. Derm: Denies rash, itchiness, skin lesions or unhealing ulcers. Psych:  Denies depression, anxiety, memory loss or confusion. Heme: Denies bruising, easy bleeding. Neuro:  Denies headaches, dizziness or paresthesias. Endo:  Denies any problems with DM, thyroid or adrenal function.  PHYSICAL EXAM: BP 128/76 (BP Location: Left Arm, Patient Position: Sitting, Cuff Size: Large)   Pulse 60   Ht 5\' 4"  (1.626 m) Comment: height measured without shoes  Wt 194 lb (88 kg)   BMI 33.30 kg/m   General: 62 year old female in no acute distress. Head: Normocephalic and atraumatic. Eyes:  Sclerae non-icteric, conjunctive pink. Ears: Normal auditory acuity. Mouth: Dentition intact. No ulcers or lesions.  Neck: Supple, no lymphadenopathy or thyromegaly.  Lungs: Clear bilaterally to auscultation without wheezes, crackles or rhonchi. Heart: Regular rate and rhythm. No murmur, rub or gallop appreciated.  Abdomen: Soft, nontender, nondistended. No masses. No hepatosplenomegaly. Normoactive bowel sounds x 4 quadrants.  Rectal: Deferred. Musculoskeletal: Symmetrical with no gross deformities. Skin: Warm and dry. No rash or lesions on visible extremities. Extremities: No edema. Neurological: Alert oriented x 4, no focal deficits.  Psychological:  Alert and cooperative. Normal mood and affect.  ASSESSMENT AND PLAN:  62 year old female with acute diarrhea and vomiting x 1 episode 05/2022 which recurred every 2 weeks x 3 or 4 occurences. Negative stool cultures per PCP. No diarrhea or vomiting x 2 months.  Colonoscopy by general surgery 08/2022 showed diverticulosis without evidence of diverticulitis or colitis.  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 -Request copy of laboratory blood tests and  stool cultures from PCP -Patient to contact our office if diarrhea or vomiting recurs -Healthy diet as tolerated   Colon cancer screening.  Colonoscopy 08/27/2022 by general surgeon Dr. Esperanza Heir at [facility] showed diverticulosis, no  polyps.  No known family history of colorectal cancer. -Next screening colonoscopy due 08/2032, earlier if symptoms warrant    CC:  Mable Paris, MD

## 2022-10-18 NOTE — Progress Notes (Signed)
Agree with assessment/plan.  Raj Gupta, MD [facility] [phone]
